# Patient Record
Sex: Male | Born: 1985 | Race: Black or African American | Hispanic: No | Marital: Single | Smoking: Never smoker
Health system: Southern US, Community
[De-identification: ages and names within clinical notes are randomized; demographics above are authoritative.]

## PROBLEM LIST (undated history)

## (undated) DIAGNOSIS — I10 Essential (primary) hypertension: Secondary | ICD-10-CM

---

## 2006-03-13 ENCOUNTER — Emergency Department: Payer: Self-pay | Admitting: Emergency Medicine

## 2007-03-20 ENCOUNTER — Emergency Department: Payer: Self-pay | Admitting: Emergency Medicine

## 2008-05-08 IMAGING — CT CT HEAD WITHOUT CONTRAST
2 series · 16 of 30 positions shown, 20 images · non-contrast
Comparison: none

REASON FOR EXAM: head injury
COMMENTS:

[Series 2: without · axial · non-contrast · 0.39mm/px · z∈[-183,-48]mm · 13 of 33 slices shown, 17 images]
[im 3/33  brain]
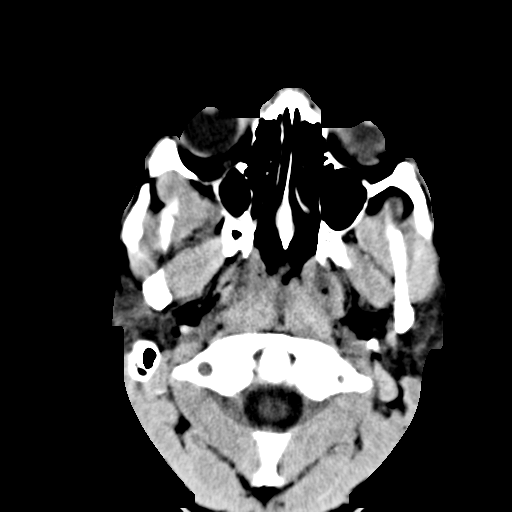
[im 3/33  bone]
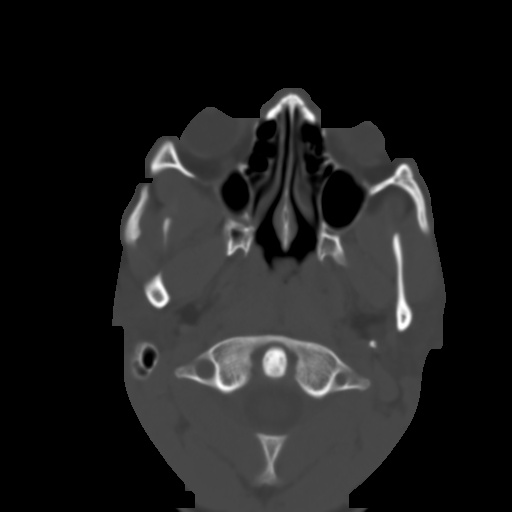
[im 5/33  brain]
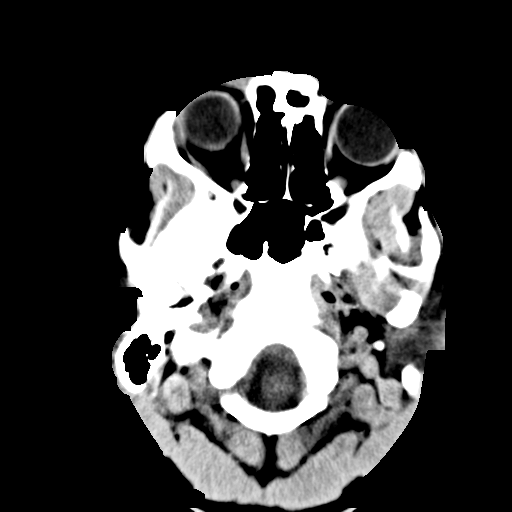
[im 7/33  brain]
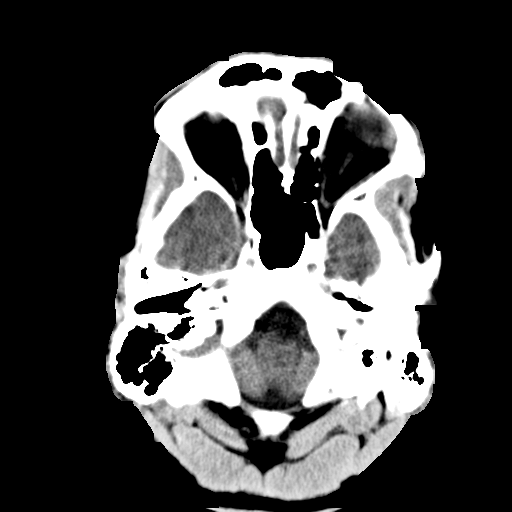
[im 10/33  brain]
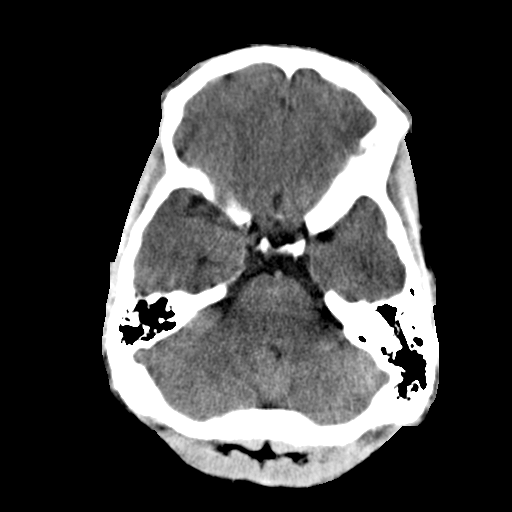
[im 12/33  brain]
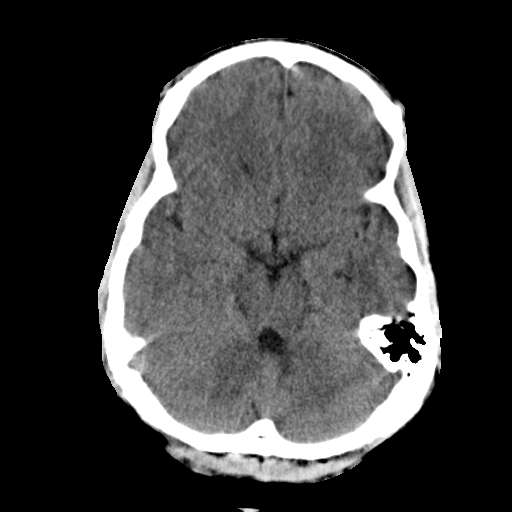
[im 12/33  bone]
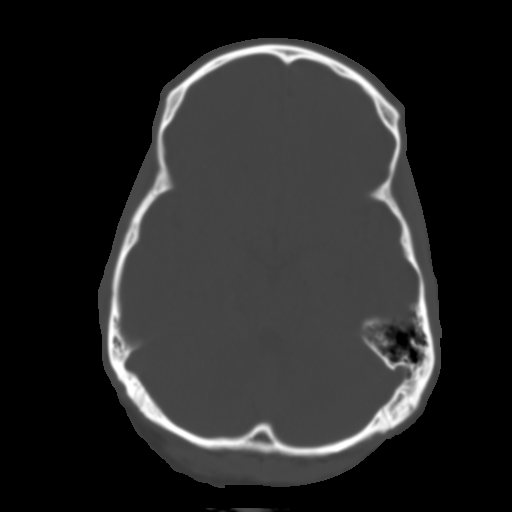
[im 14/33  brain]
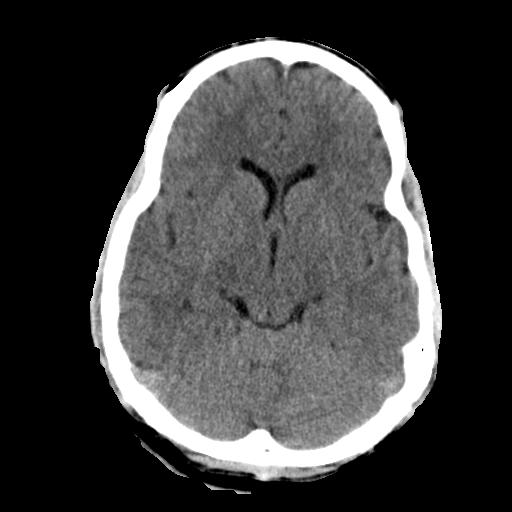
[im 17/33  brain]
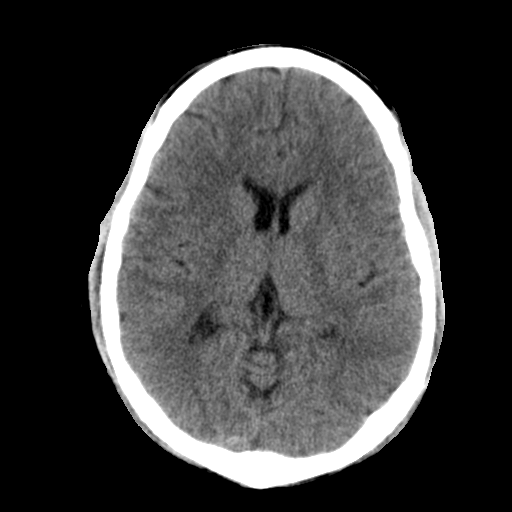
[im 19/33  brain]
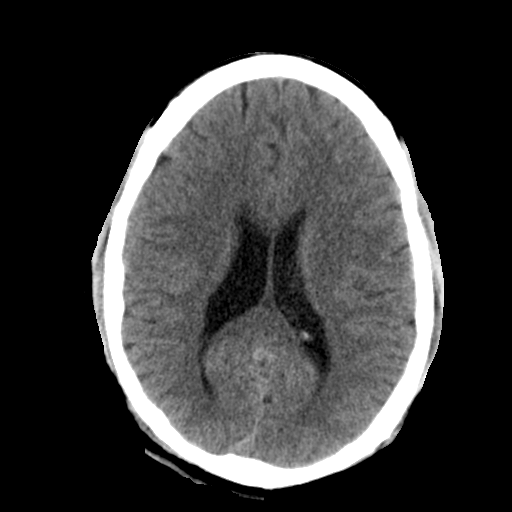
[im 21/33  brain]
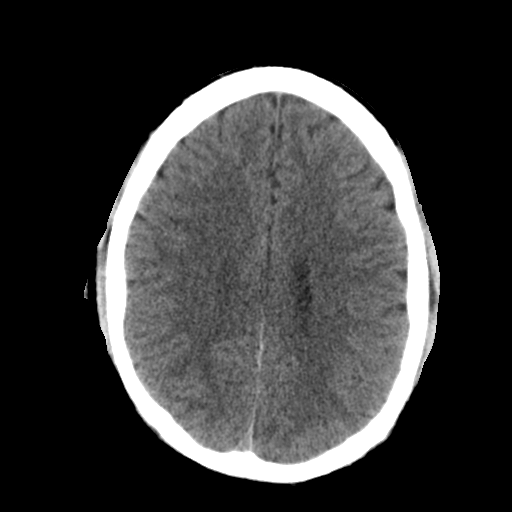
[im 21/33  bone]
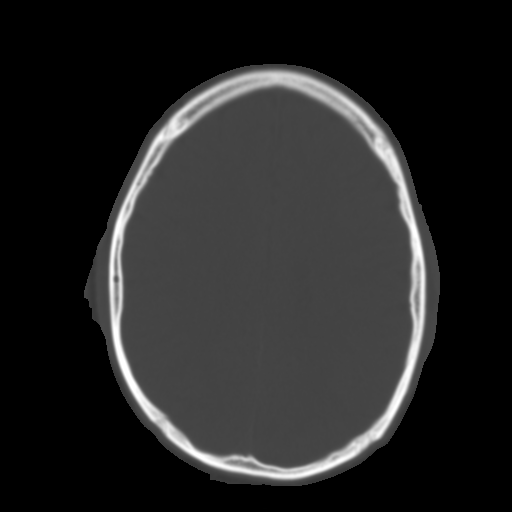
[im 23/33  brain]
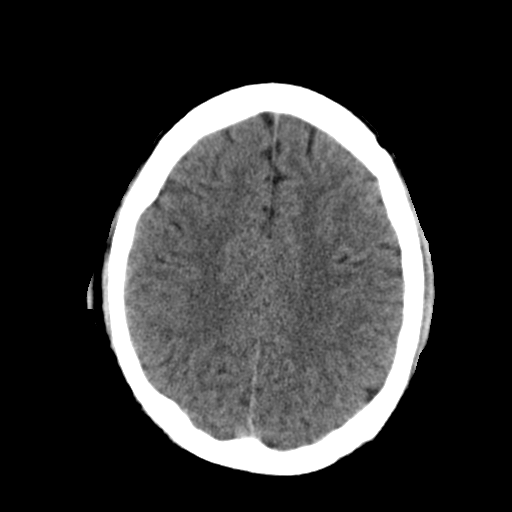
[im 26/33  brain]
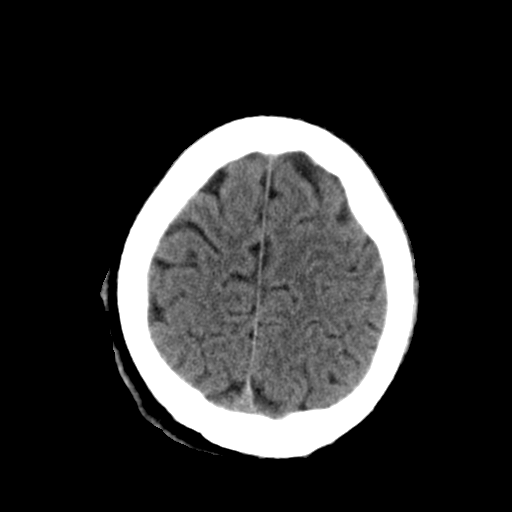
[im 28/33  brain]
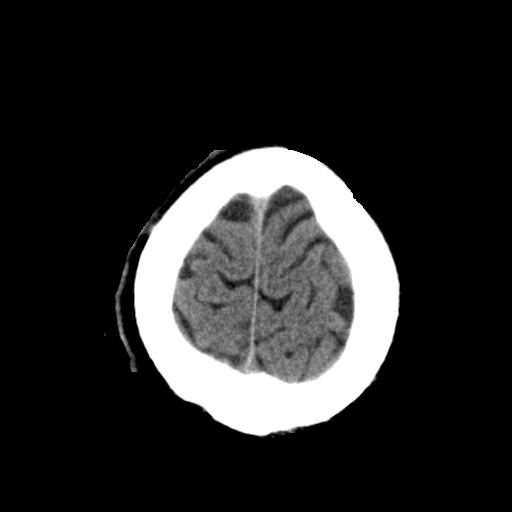
[im 30/33  brain]
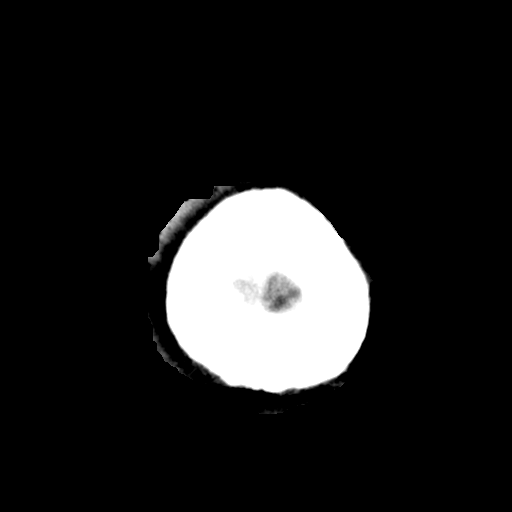
[im 30/33  bone]
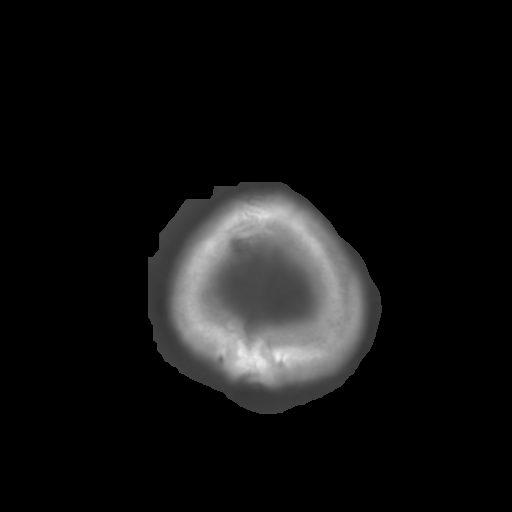

[Series 3: bone · axial · 0.39mm/px · z∈[-183,-138]mm · 3 of 33 slices shown]
[im 3/33  bone]
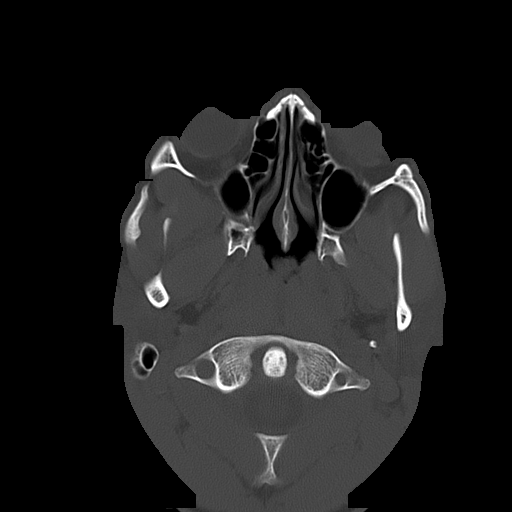
[im 7/33  bone]
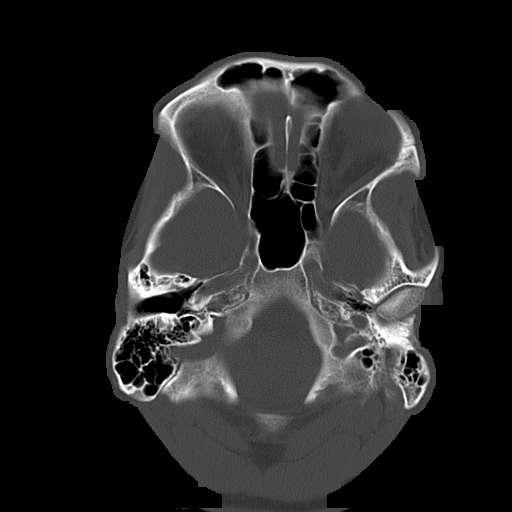
[im 12/33  bone]
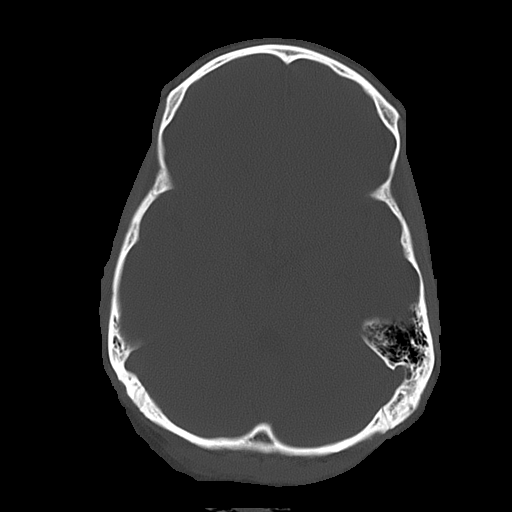

[16 of 30 positions shown; findings below may reference images not displayed]

PROCEDURE:     CT  - CT HEAD WITHOUT CONTRAST  - March 20, 2007 [DATE]

RESULT:     Multiple areas of scalp hematoma are identified within the RIGHT
frontal and frontoparietal region. There does not appear to be evidence of
acute hemorrhage, intracranial acute hemorrhages, free fluid, drainable
loculated fluid collections, midline shift or mass effect.  The visualized
bony skeleton demonstrates no evidence of fracture or dislocation.
IMPRESSION: 1)Scalp hematomas without evidence of acute intracranial or focal
intracranial abnormalities.

Dr. Greg of the Emergency Room was informed of these findings via a
preliminary faxed report on 03-20-07.

## 2009-12-06 ENCOUNTER — Emergency Department: Payer: Self-pay | Admitting: Emergency Medicine

## 2010-04-07 ENCOUNTER — Emergency Department: Payer: Self-pay | Admitting: Emergency Medicine

## 2010-04-08 ENCOUNTER — Emergency Department: Payer: Self-pay | Admitting: Emergency Medicine

## 2014-10-06 ENCOUNTER — Emergency Department: Payer: Self-pay | Admitting: Emergency Medicine

## 2014-10-06 LAB — CBC WITH DIFFERENTIAL/PLATELET
BASOS PCT: 0.2 %
Basophil #: 0 10*3/uL (ref 0.0–0.1)
EOS PCT: 0.2 %
Eosinophil #: 0 10*3/uL (ref 0.0–0.7)
HCT: 46 % (ref 40.0–52.0)
HGB: 15.2 g/dL (ref 13.0–18.0)
Lymphocyte #: 1.2 10*3/uL (ref 1.0–3.6)
Lymphocyte %: 8.9 %
MCH: 31.3 pg (ref 26.0–34.0)
MCHC: 33.2 g/dL (ref 32.0–36.0)
MCV: 94 fL (ref 80–100)
MONOS PCT: 7.6 %
Monocyte #: 1.1 x10 3/mm — ABNORMAL HIGH (ref 0.2–1.0)
Neutrophil #: 11.5 10*3/uL — ABNORMAL HIGH (ref 1.4–6.5)
Neutrophil %: 83.1 %
PLATELETS: 236 10*3/uL (ref 150–440)
RBC: 4.87 10*6/uL (ref 4.40–5.90)
RDW: 13.5 % (ref 11.5–14.5)
WBC: 13.8 10*3/uL — ABNORMAL HIGH (ref 3.8–10.6)

## 2017-07-11 ENCOUNTER — Emergency Department
Admission: EM | Admit: 2017-07-11 | Discharge: 2017-07-11 | Disposition: A | Discharge status: Home / Self Care | Payer: Self-pay | Attending: Emergency Medicine | Admitting: Emergency Medicine

## 2017-07-11 ENCOUNTER — Encounter: Payer: Self-pay | Admitting: Medical Oncology

## 2017-07-11 DIAGNOSIS — M25512 Pain in left shoulder: Secondary | ICD-10-CM | POA: Insufficient documentation

## 2017-07-11 LAB — GLUCOSE, CAPILLARY: Glucose-Capillary: 98 mg/dL (ref 65–99)

## 2017-07-11 MED ORDER — KETOROLAC TROMETHAMINE 30 MG/ML IJ SOLN
30.0000 mg | Freq: Once | INTRAMUSCULAR | Status: AC
  Administered 2017-07-11: 30 mg via INTRAMUSCULAR
  Filled 2017-07-11: qty 1

## 2017-07-11 MED ORDER — NAPROXEN 500 MG PO TABS: 500.0000 mg | ORAL_TABLET | Freq: Two times a day (BID) | ORAL | 0 refills | Status: DC

## 2017-07-11 NOTE — ED Notes (Signed)
Says did a lot of the same thing yesterday at work.  Says today was just making boxes and the left shoulder still hurts with certain movements.  Especially when he tries to lift it.  Also has had some numbnessin fingers for about a month.  Usually thumbs and sometimse 2nd and 3rd fingers on both hands.  Pt in nad.

## 2017-07-11 NOTE — ED Triage Notes (Signed)
Pt reports left shoulder pain that began yesterday while making boxes at work, pain with movement.

## 2017-07-11 NOTE — Discharge Instructions (Addendum)
Follow-up with Dr. Ernest Pine if any continued problems with your shoulder pain. You may use ice or heat to your shoulder as needed for comfort. Begin taking naproxen 500 mg twice a day with food.

## 2017-07-11 NOTE — ED Provider Notes (Signed)
[facility] Emergency Department Provider Note  ____________________________________________   First MD Initiated Contact with Patient 07/11/17 [phone]     (approximate)  I have reviewed the triage vital signs and the nursing notes.   HISTORY  Chief Complaint Shoulder Injury   HPI Jordan Saunders is a 31 y.o. male is here complaining of left shoulder pain. Patient states that he believes that the pain started after moving a lot of boxes at work yesterday. He denies specific injury. He has not taken any over-the-counter medication for his pain. She denies any previous injury to his left shoulder. Pain is increased with range of motion. Currently rates his pain is 7 out of 10.  History reviewed. No pertinent past medical history.  There are no active problems to display for this patient.   No past surgical history on file.  Prior to Admission medications   Medication Sig Start Date End Date Taking? Authorizing Provider  naproxen (NAPROSYN) 500 MG tablet Take 1 tablet (500 mg total) by mouth 2 (two) times daily with a meal. 07/11/17   Tommi Rumps, PA-C    Allergies Patient has no known allergies.  No family history on file.  Social History Social History  Substance Use Topics  . Smoking status: Not on file  . Smokeless tobacco: Not on file  . Alcohol use Not on file    Review of Systems Constitutional: No fever/chills Cardiovascular: Denies chest pain. Respiratory: Denies shortness of breath. Gastrointestinal:   No nausea, no vomiting.  Musculoskeletal: Positive for left shoulder pain. Neurological: Negative for headaches, focal weakness.  Positive chronic sensation alteration distal digits bilateral hands. Patient has family members who are diabetic that is on sure of his status.  ____________________________________________   PHYSICAL EXAM:  VITAL SIGNS: ED Triage Vitals  Enc Vitals Group     BP 07/11/17 0727 128/83     Pulse  Rate 07/11/17 0727 72     Resp 07/11/17 0727 18     Temp 07/11/17 0727 98.5 F (36.9 C)     Temp Source 07/11/17 0727 Oral     SpO2 07/11/17 0727 96 %     Weight 07/11/17 0726 195 lb (88.5 kg)     Height 07/11/17 0726  (1.727 m)     Head Circumference --      Peak Flow --      Pain Score 07/11/17 0725 7     Pain Loc --      Pain Edu? --      Excl. in GC? --    Constitutional: Alert and oriented. Well appearing and in no acute distress. Eyes: Conjunctivae are normal.  Head: Atraumatic. Mouth/Throat: Mucous membranes are moist.  Oropharynx non-erythematous. Neck: No stridor.  Nontender cervical spine to palpation posteriorly. Cardiovascular: Normal rate, regular rhythm. Grossly normal heart sounds.  Good peripheral circulation. Respiratory: Normal respiratory effort.  No retractions. Lungs CTAB. Musculoskeletal: Examination of the left shoulder there is no gross deformity or soft tissue swelling present. No discoloration is present. Range of motion is without crepitus.  Minimal restriction of range of motion secondary to pain. Motor sensory function intact. Neurologic:  Normal speech and language. No gross focal neurologic deficits are appreciated. No gait instability. Skin:  Skin is warm, dry and intact. No ecchymosis, abrasion, erythema noted. Psychiatric: Mood and affect are normal. Speech and behavior are normal.  ____________________________________________   LABS (all labs ordered are listed, but only abnormal results are displayed)  Labs Reviewed  GLUCOSE, CAPILLARY  CBG MONITORING, ED    RADIOLOGY  Deferred ____________________________________________   PROCEDURES  Procedure(s) performed: None  Procedures  Critical Care performed: No  ____________________________________________   INITIAL IMPRESSION / ASSESSMENT AND PLAN / ED COURSE  patient was given Toradol 30 mg IM since he had not taken any over-the-counter medication for his pain. Patient had  improvement of his shoulder pain and we discussed continued use of anti-inflammatories. He is given a prescription for naproxen 500 mg twice a day with food. He is to follow-up with Dr. Ernest Pine if any continued problems with his shoulder. Patient was fasting at the time he was seen and had a fasting fingerstick done. Patient was made aware that his blood sugar was 98 which is completely normal.   ___________________________________________   FINAL CLINICAL IMPRESSION(S) / ED DIAGNOSES  Final diagnoses:  Acute pain of left shoulder      NEW MEDICATIONS STARTED DURING THIS VISIT:  Discharge Medication List as of 07/11/2017  8:52 AM    START taking these medications   Details  naproxen (NAPROSYN) 500 MG tablet Take 1 tablet (500 mg total) by mouth 2 (two) times daily with a meal., Starting Tue 07/11/2017, Print         Note:  This document was prepared using Dragon voice recognition software and may include unintentional dictation errors.    Tommi Rumps, PA-C 07/11/17 1347    Merrily Brittle, MD 07/11/17 2002

## 2020-10-27 DIAGNOSIS — U071 COVID-19: Secondary | ICD-10-CM | POA: Insufficient documentation

## 2020-10-27 DIAGNOSIS — R059 Cough, unspecified: Secondary | ICD-10-CM | POA: Diagnosis present

## 2020-10-27 DIAGNOSIS — R0602 Shortness of breath: Secondary | ICD-10-CM | POA: Insufficient documentation

## 2020-10-27 DIAGNOSIS — J1282 Pneumonia due to coronavirus disease 2019: Secondary | ICD-10-CM | POA: Insufficient documentation

## 2020-10-28 ENCOUNTER — Emergency Department
Admission: EM | Admit: 2020-10-28 | Discharge: 2020-10-28 | Disposition: A | Discharge status: Home / Self Care | Payer: HRSA Program | Attending: Emergency Medicine | Admitting: Emergency Medicine

## 2020-10-28 ENCOUNTER — Other Ambulatory Visit: Payer: Self-pay

## 2020-10-28 ENCOUNTER — Emergency Department: Payer: HRSA Program

## 2020-10-28 ENCOUNTER — Encounter: Payer: Self-pay | Admitting: Emergency Medicine

## 2020-10-28 DIAGNOSIS — U071 COVID-19: Secondary | ICD-10-CM

## 2020-10-28 DIAGNOSIS — J1282 Pneumonia due to coronavirus disease 2019: Secondary | ICD-10-CM

## 2020-10-28 LAB — BASIC METABOLIC PANEL
Anion gap: 13 (ref 5–15)
BUN: 10 mg/dL (ref 6–20)
CO2: 22 mmol/L (ref 22–32)
Calcium: 9.1 mg/dL (ref 8.9–10.3)
Chloride: 99 mmol/L (ref 98–111)
Creatinine, Ser: 1.25 mg/dL — ABNORMAL HIGH (ref 0.61–1.24)
GFR, Estimated: >60 mL/min (ref >60)
Glucose, Bld: 127 mg/dL — ABNORMAL HIGH (ref 70–99)
Potassium: 3.9 mmol/L (ref 3.5–5.1)
Sodium: 134 mmol/L — ABNORMAL LOW (ref 135–145)

## 2020-10-28 LAB — CBC
HCT: 41.7 % (ref 39.0–52.0)
Hemoglobin: 14.4 g/dL (ref 13.0–17.0)
MCH: 30.4 pg (ref 26.0–34.0)
MCHC: 34.5 g/dL (ref 30.0–36.0)
MCV: 88 fL (ref 80.0–100.0)
Platelets: 211 10*3/uL (ref 150–400)
RBC: 4.74 MIL/uL (ref 4.22–5.81)
RDW: 12.8 % (ref 11.5–15.5)
WBC: 5 10*3/uL (ref 4.0–10.5)
nRBC: 0 % (ref 0.0–0.2)

## 2020-10-28 LAB — TROPONIN I (HIGH SENSITIVITY): Troponin I (High Sensitivity): 4 ng/L (ref <18)

## 2020-10-28 LAB — POC SARS CORONAVIRUS 2 AG -  ED: SARS Coronavirus 2 Ag: POSITIVE — AB

## 2020-10-28 MED ORDER — HYDROCOD POLST-CPM POLST ER 10-8 MG/5ML PO SUER: 5.0000 mL | Freq: Two times a day (BID) | ORAL | 0 refills | Status: AC

## 2020-10-28 MED ORDER — AZITHROMYCIN 500 MG PO TABS
500.0000 mg | ORAL_TABLET | Freq: Once | ORAL | Status: AC
  Administered 2020-10-28: 500 mg via ORAL
  Filled 2020-10-28: qty 1

## 2020-10-28 MED ORDER — AZITHROMYCIN 250 MG PO TABS: 250.0000 mg | ORAL_TABLET | Freq: Every day | ORAL | 0 refills | Status: AC

## 2020-10-28 MED ORDER — PREDNISONE 50 MG PO TABS: ORAL_TABLET | ORAL | 0 refills | Status: DC

## 2020-10-28 MED ORDER — ALBUTEROL SULFATE HFA 108 (90 BASE) MCG/ACT IN AERS
2.0000 | INHALATION_SPRAY | RESPIRATORY_TRACT | 0 refills | Status: DC | PRN
Start: 2020-10-28 — End: 2024-07-15

## 2020-10-28 MED ORDER — HYDROCOD POLST-CPM POLST ER 10-8 MG/5ML PO SUER
5.0000 mL | Freq: Once | ORAL | Status: AC
  Administered 2020-10-28: 5 mL via ORAL
  Filled 2020-10-28: qty 5

## 2020-10-28 MED ORDER — PREDNISONE 20 MG PO TABS
50.0000 mg | ORAL_TABLET | Freq: Once | ORAL | Status: AC
  Administered 2020-10-28: 50 mg via ORAL
  Filled 2020-10-28: qty 3

## 2020-10-28 NOTE — ED Provider Notes (Signed)
[facility] Emergency Department Provider Note   ____________________________________________   Event Date/Time   First MD Initiated Contact with Patient 10/28/20 [phone]     (approximate)  I have reviewed the triage vital signs and the nursing notes.   HISTORY  Chief Complaint Chills, Cough, and Shortness of Breath    HPI Jordan Saunders is a 35 y.o. male who presents to the ED from home with a chief complaint of cough, body aches, chills, headache and shortness of breath.  Symptoms x1 week, worse over the past 2 days.  Patient is unvaccinated against COVID-19.  Also reports sharp right rib pain only after hard coughing.  Denies abdominal pain, nausea/vomiting/diarrhea.     Past medical history None  There are no problems to display for this patient.   History reviewed. No pertinent surgical history.  Prior to Admission medications   Medication Sig Start Date End Date Taking? Authorizing Provider  albuterol (VENTOLIN HFA) 108 (90 Base) MCG/ACT inhaler Inhale 2 puffs into the lungs every 4 (four) hours as needed for wheezing or shortness of breath. 10/28/20  Yes Irean Hong, MD  azithromycin (ZITHROMAX) 250 MG tablet Take 1 tablet (250 mg total) by mouth daily. 10/28/20  Yes Irean Hong, MD  chlorpheniramine-HYDROcodone [facility] PENNKINETIC ER) 10-8 MG/5ML SUER Take 5 mLs by mouth 2 (two) times daily. 10/28/20  Yes Irean Hong, MD  predniSONE (DELTASONE) 50 MG tablet 1 tablet daily until finished 10/28/20  Yes Irean Hong, MD  naproxen (NAPROSYN) 500 MG tablet Take 1 tablet (500 mg total) by mouth 2 (two) times daily with a meal. 07/11/17   Tommi Rumps, PA-C    Allergies Patient has no known allergies.  History reviewed. No pertinent family history.  Social History Social History   Tobacco Use  . Smoking status: Never Smoker  . Smokeless tobacco: Never Used  Substance Use Topics  . Alcohol use: Yes    Alcohol/week: 6.0 standard drinks     Types: 6 Cans of beer per week  . Drug use: Yes    Types: Marijuana    Review of Systems  Constitutional: No fever/chills Eyes: No visual changes. ENT: No sore throat. Cardiovascular: Positive for right rib pain on coughing.  Denies chest pain. Respiratory: Positive for productive cough and shortness of breath. Gastrointestinal: No abdominal pain.  No nausea, no vomiting.  No diarrhea.  No constipation. Genitourinary: Negative for dysuria. Musculoskeletal: Negative for back pain. Skin: Negative for rash. Neurological: Negative for headaches, focal weakness or numbness.   ____________________________________________   PHYSICAL EXAM:  VITAL SIGNS: ED Triage Vitals  Enc Vitals Group     BP 10/28/20 0052 (!) 144/83     Pulse Rate 10/28/20 0052 (!) 103     Resp 10/28/20 0052 20     Temp 10/28/20 0052 99.4 F (37.4 C)     Temp Source 10/28/20 0052 Oral     SpO2 10/28/20 0052 96 %     Weight 10/28/20 0053 200 lb (90.7 kg)     Height 10/28/20 0053 5\' 7"  (1.702 m)     Head Circumference --      Peak Flow --      Pain Score 10/28/20 0053 7     Pain Loc --      Pain Edu? --      Excl. in GC? --     Constitutional: Alert and oriented. Well appearing and in no acute distress. Eyes: Conjunctivae are normal.  PERRL. EOMI. Head: Atraumatic. Nose: No congestion/rhinnorhea. Mouth/Throat: Mucous membranes are moist.   Neck: No stridor.   Cardiovascular: Normal rate, regular rhythm. Grossly normal heart sounds.  Good peripheral circulation. Respiratory: Normal respiratory effort.  No retractions. Lungs CTAB. Gastrointestinal: Soft and nontender. No distention. No abdominal bruits. No CVA tenderness. Musculoskeletal: No lower extremity tenderness nor edema.  No joint effusions. Neurologic:  Normal speech and language. No gross focal neurologic deficits are appreciated. No gait instability. Skin:  Skin is warm, dry and intact. No rash noted. Psychiatric: Mood and affect are  normal. Speech and behavior are normal.  ____________________________________________   LABS (all labs ordered are listed, but only abnormal results are displayed)  Labs Reviewed  BASIC METABOLIC PANEL - Abnormal; Notable for the following components:      Result Value   Sodium 134 (*)    Glucose, Bld 127 (*)    Creatinine, Ser 1.25 (*)    All other components within normal limits  POC SARS CORONAVIRUS 2 AG -  ED - Abnormal; Notable for the following components:   SARS Coronavirus 2 Ag Positive (*)    All other components within normal limits  CBC  TROPONIN I (HIGH SENSITIVITY)   ____________________________________________  EKG  None ____________________________________________  RADIOLOGY I, Rossanna Spitzley J, personally viewed and evaluated these images (plain radiographs) as part of my medical decision making, as well as reviewing the written report by the radiologist.  ED MD interpretation: Atypical multifocal pneumonia  Official radiology report(s): DG Chest 2 View  Result Date: 10/28/2020 CLINICAL DATA:  Shortness of breath. EXAM: CHEST - 2 VIEW COMPARISON:  None. FINDINGS: There are hazy peripheral bilateral airspace opacities. No pneumothorax. Large pleural effusion. The heart size is unremarkable. There is no acute osseous abnormality. IMPRESSION: Hazy peripheral bilateral airspace opacities consistent with multifocal pneumonia (viral or bacterial). Electronically Signed   By: Katherine Mantle M.D.   On: 10/28/2020 01:22    ____________________________________________   PROCEDURES  Procedure(s) performed (including Critical Care):  Procedures   ____________________________________________   INITIAL IMPRESSION / ASSESSMENT AND PLAN / ED COURSE  As part of my medical decision making, I reviewed the following data within the electronic MEDICAL RECORD NUMBER Nursing notes reviewed and incorporated, Labs reviewed, Old chart reviewed, Radiograph reviewed and Notes from  prior ED visits     35 year old otherwise healthy male presenting with cough, body aches, chills, headache, rib pain on coughing. Differential includes, but is not limited to, viral syndrome, bronchitis including COPD exacerbation, pneumonia, reactive airway disease including asthma, CHF including exacerbation with or without pulmonary/interstitial edema, pneumothorax, ACS, thoracic trauma, and pulmonary embolism.  Laboratory results remarkable for mild AKI.  Rapid antigen Covid positive.  Multifocal pneumonia on chest x-ray.  Patient is afebrile, not tachypneic nor hypoxic.  Will treat with steroids, antibiotic, cough medicine and albuterol inhaler.  Strict return precautions given.  Patient verbalizes understanding agrees with plan of care.      ____________________________________________   FINAL CLINICAL IMPRESSION(S) / ED DIAGNOSES  Final diagnoses:  Pneumonia due to COVID-19 virus     ED Discharge Orders         Ordered    albuterol (VENTOLIN HFA) 108 (90 Base) MCG/ACT inhaler  Every 4 hours PRN        10/28/20 0305    predniSONE (DELTASONE) 50 MG tablet        10/28/20 0305    chlorpheniramine-HYDROcodone (TUSSIONEX PENNKINETIC ER) 10-8 MG/5ML SUER  2 times daily  10/28/20 0305    azithromycin (ZITHROMAX) 250 MG tablet  Daily        10/28/20 0305          *Please note:  Jordan Saunders was evaluated in Emergency Department on 10/28/2020 for the symptoms described in the history of present illness. He was evaluated in the context of the global COVID-19 pandemic, which necessitated consideration that the patient might be at risk for infection with the SARS-CoV-2 virus that causes COVID-19. Institutional protocols and algorithms that pertain to the evaluation of patients at risk for COVID-19 are in a state of rapid change based on information released by regulatory bodies including the CDC and federal and state organizations. These policies and algorithms were followed  during the patient's care in the ED.  Some ED evaluations and interventions may be delayed as a result of limited staffing during and the pandemic.*   Note:  This document was prepared using Dragon voice recognition software and may include unintentional dictation errors.   Irean Hong, MD 10/28/20 [phone]

## 2020-10-28 NOTE — ED Notes (Signed)
Dr. Dolores Frame notified of positive POC SARS coronavirus test.

## 2020-10-28 NOTE — ED Notes (Signed)
Patient discharged to home per MD order. Patient in stable condition, and deemed medically cleared by ED provider for discharge. Discharge instructions reviewed with patient/family using "Teach Back"; verbalized understanding of medication education and administration, and information about follow-up care. Denies further concerns. ° °

## 2020-10-28 NOTE — ED Triage Notes (Signed)
Pt to ED from home c/o cough, body aches, chills, headache, SOB for a couple days.  Denies known exposure to COVID, denies COVID vaccine, states right rib pain after hard coughs.  Productive yellow cough.

## 2020-10-28 NOTE — ED Notes (Signed)
Pt states coming in with shortness of breath. Pt states he has not been around anyone with covid-19. Pt states rib cage pain.

## 2020-10-28 NOTE — Discharge Instructions (Addendum)
1.  Take steroid and antibiotic daily for the next 4 days: Prednisone 50 mg Azithromycin 250 mg 2.  You may take Tussionex as needed for cough. 3.  You may use albuterol inhaler 2 puffs every 4 hours as needed for difficulty breathing. 4.  Return to the ER for worsening symptoms, persistent vomiting, difficulty breathing or other concerns.

## 2020-10-29 ENCOUNTER — Telehealth: Payer: Self-pay

## 2020-10-29 NOTE — Telephone Encounter (Signed)
Called to discuss with patient about COVID-19 symptoms and the use of one of the available treatments for those with mild to moderate Covid symptoms and at a high risk of hospitalization.  Pt appears to qualify for outpatient treatment due to co-morbid conditions and/or a member of an at-risk group in accordance with the FDA Emergency Use Authorization.    Symptom onset: 10/21/20 Vaccinated: Unknown Booster? Unknown Immunocompromised? No Qualifiers: Obesity Pt. Is out of 7 day window for treatment. Unable to reach pt - Voice mailbox is full, unable to leave a message.  Esther Hardy

## 2021-12-17 IMAGING — CR DG CHEST 2V
1 series · 2 of 2 positions shown · non-contrast
Comparison: None.

CLINICAL DATA: Shortness of breath.

EXAM:
CHEST - 2 VIEW

[Series 1: dg chest 2 view · 0.14mm/px · 2 of 2 slices shown]
[im 1/2]
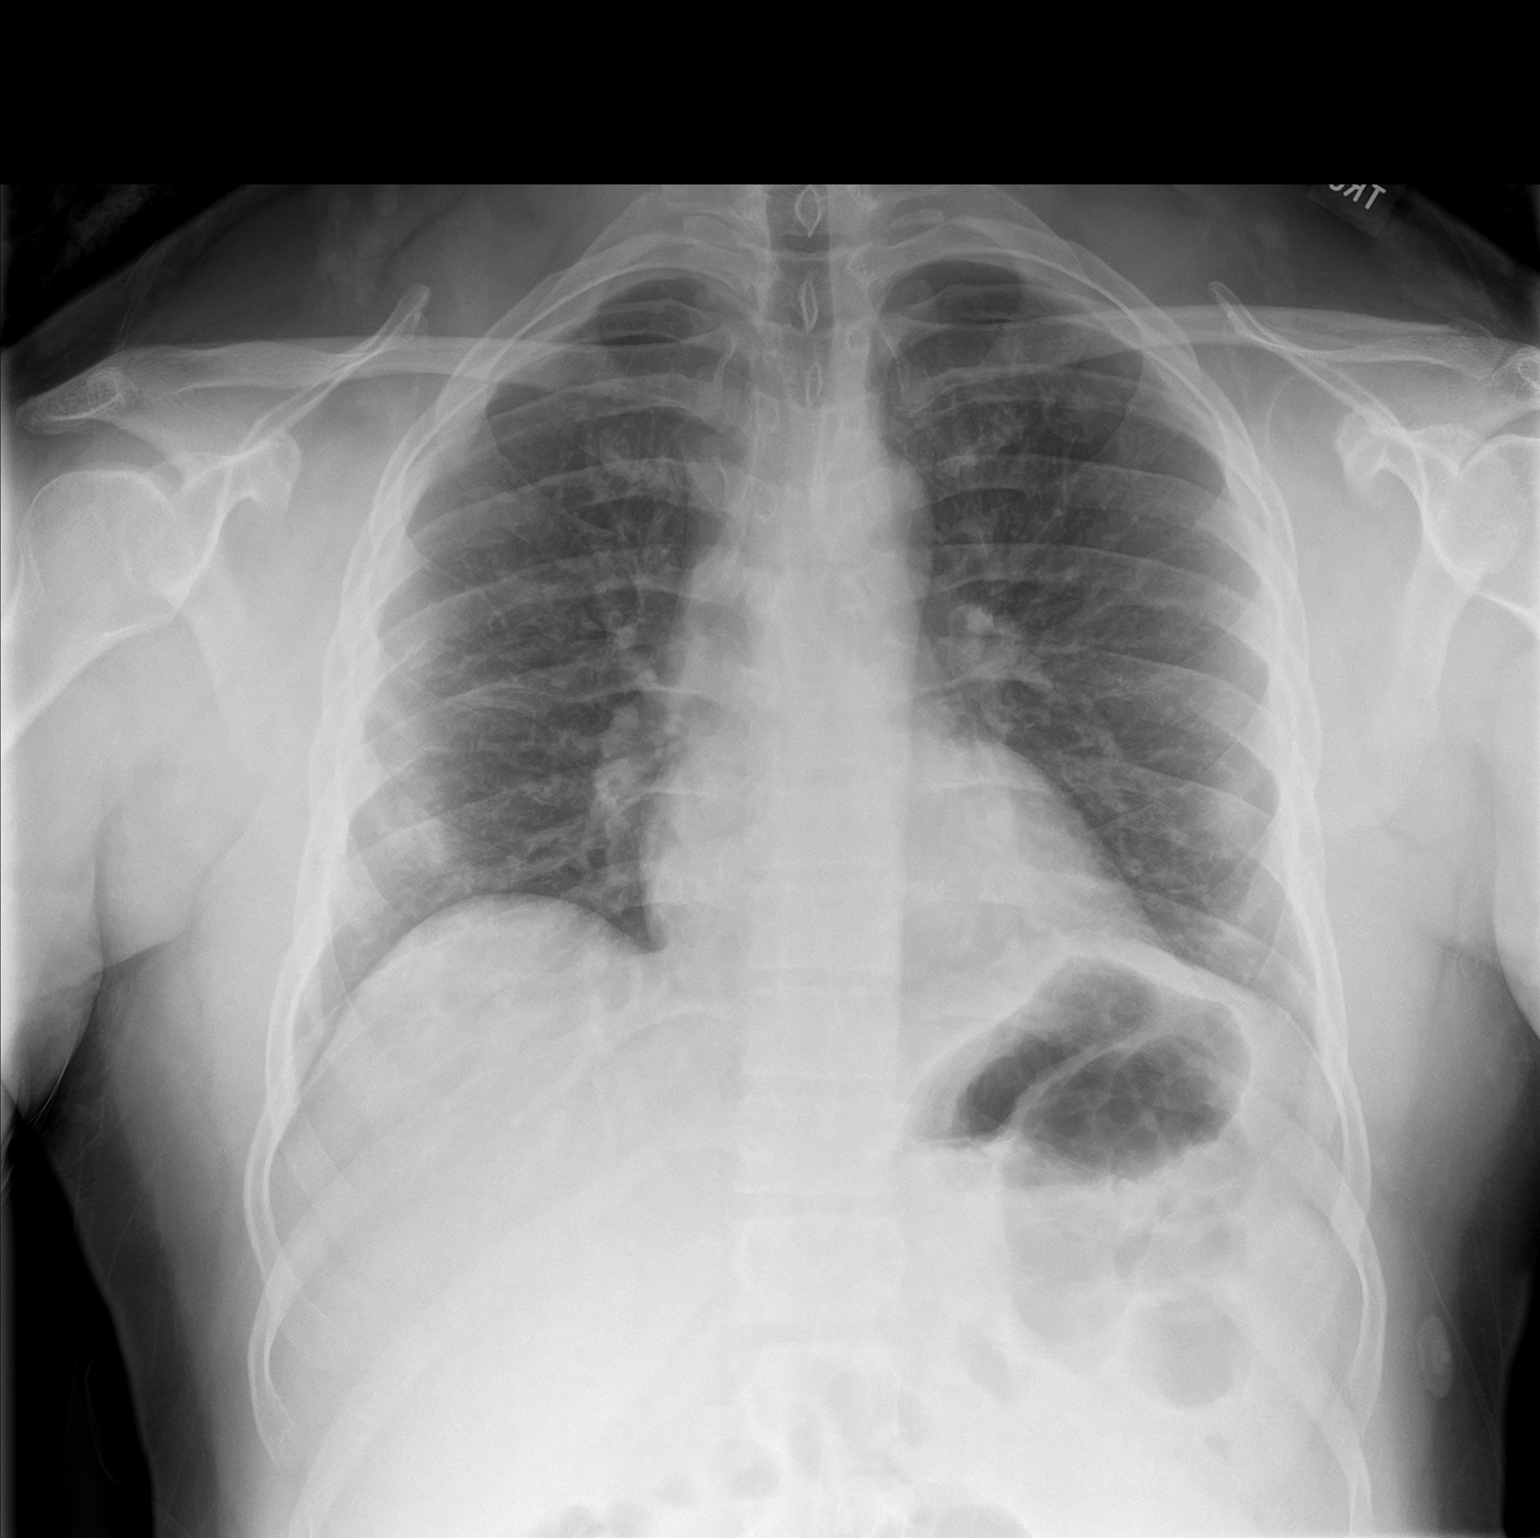
[im 2/2]
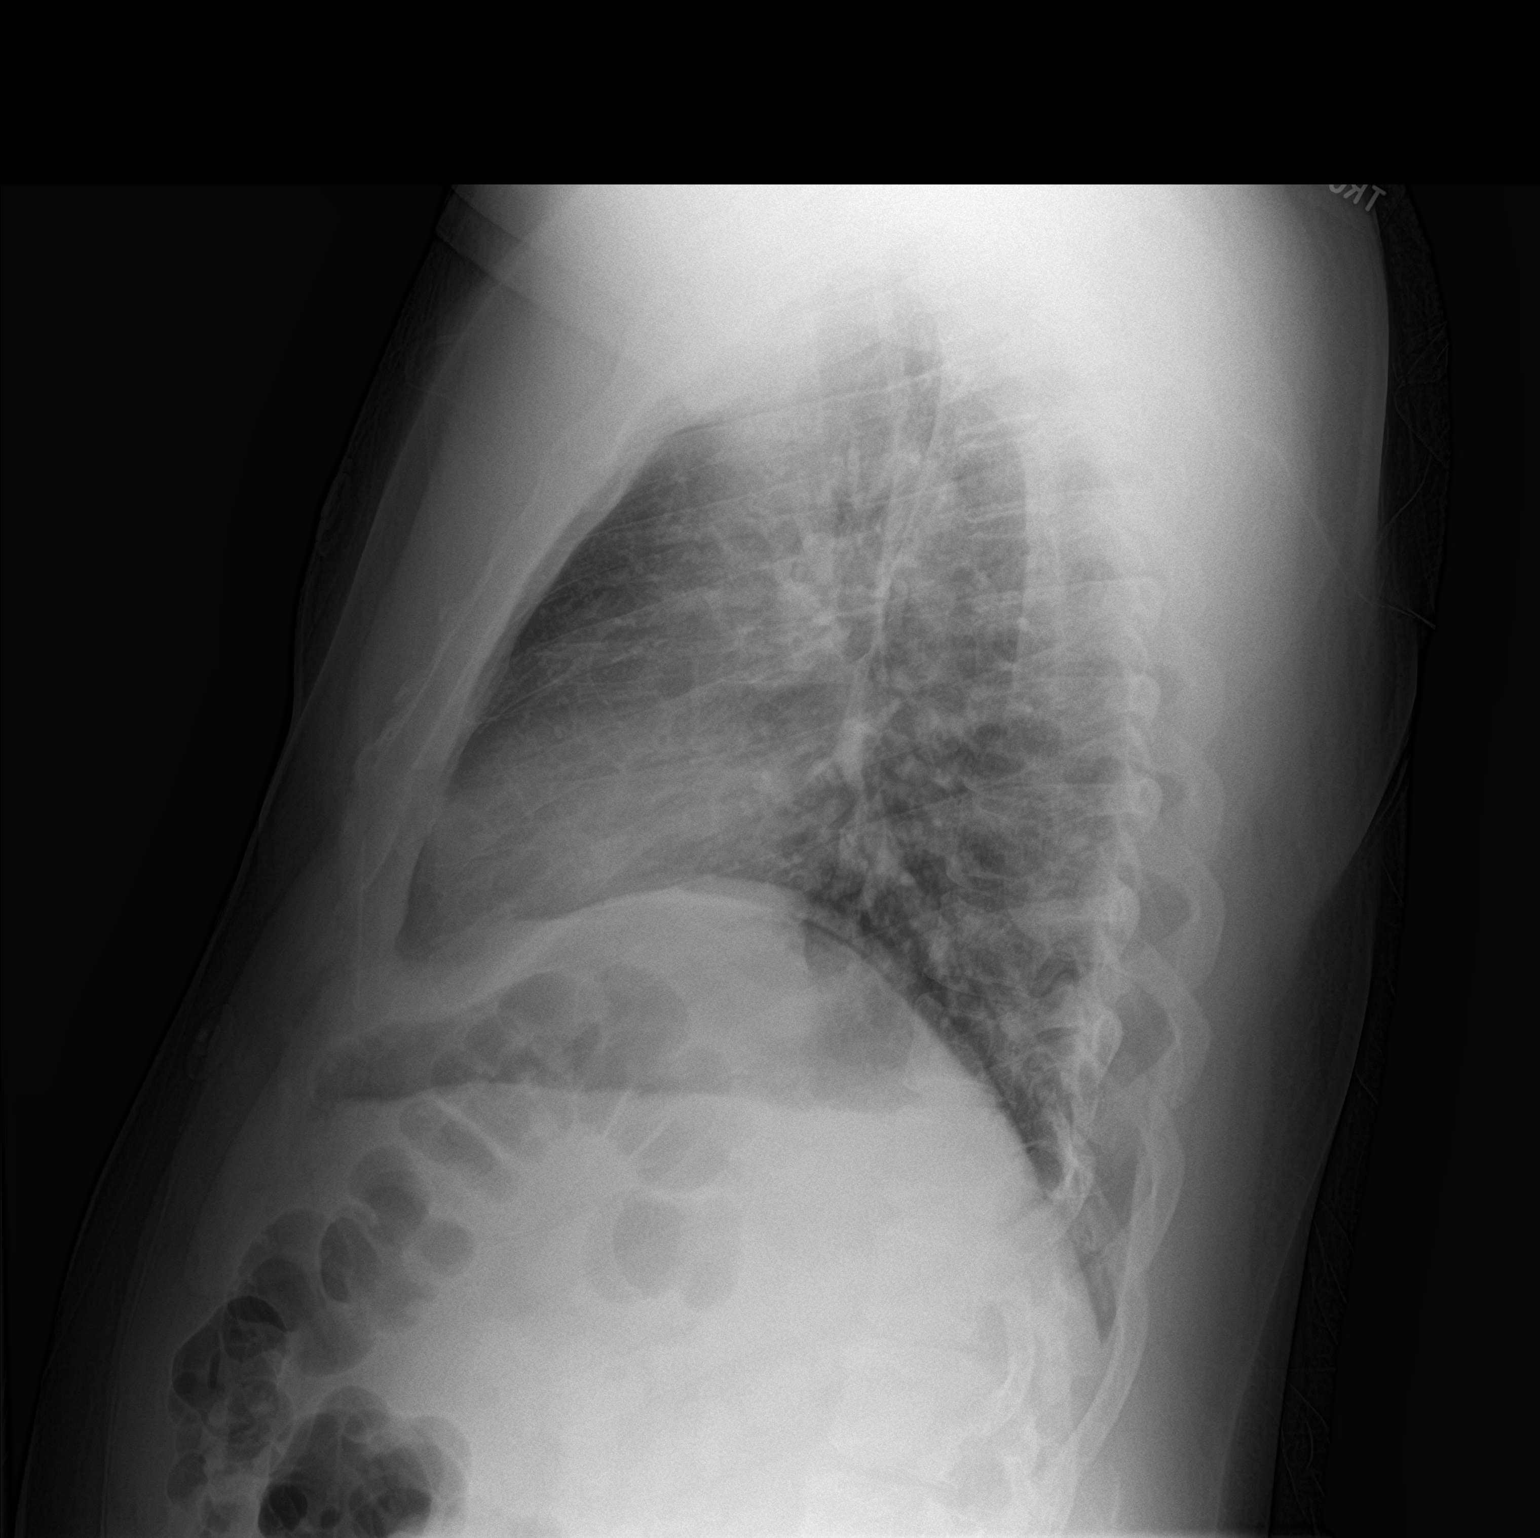

[2 of 2 positions shown; findings below may reference images not displayed]

FINDINGS: There are hazy peripheral bilateral airspace opacities. No
pneumothorax. Large pleural effusion. The heart size is
unremarkable. There is no acute osseous abnormality.
IMPRESSION: Hazy peripheral bilateral airspace opacities consistent with
multifocal pneumonia (viral or bacterial).

## 2022-09-02 DIAGNOSIS — Z419 Encounter for procedure for purposes other than remedying health state, unspecified: Secondary | ICD-10-CM | POA: Diagnosis not present

## 2022-10-03 DIAGNOSIS — Z419 Encounter for procedure for purposes other than remedying health state, unspecified: Secondary | ICD-10-CM | POA: Diagnosis not present

## 2022-11-03 DIAGNOSIS — Z419 Encounter for procedure for purposes other than remedying health state, unspecified: Secondary | ICD-10-CM | POA: Diagnosis not present

## 2022-12-02 DIAGNOSIS — Z419 Encounter for procedure for purposes other than remedying health state, unspecified: Secondary | ICD-10-CM | POA: Diagnosis not present

## 2023-01-02 DIAGNOSIS — Z419 Encounter for procedure for purposes other than remedying health state, unspecified: Secondary | ICD-10-CM | POA: Diagnosis not present

## 2023-01-31 ENCOUNTER — Telehealth: Payer: Self-pay

## 2023-01-31 NOTE — Telephone Encounter (Signed)
Attempted to contact patient to schedule apt. No working #. AS, CMA 

## 2023-02-01 DIAGNOSIS — Z419 Encounter for procedure for purposes other than remedying health state, unspecified: Secondary | ICD-10-CM | POA: Diagnosis not present

## 2023-03-04 DIAGNOSIS — Z419 Encounter for procedure for purposes other than remedying health state, unspecified: Secondary | ICD-10-CM | POA: Diagnosis not present

## 2023-04-03 DIAGNOSIS — Z419 Encounter for procedure for purposes other than remedying health state, unspecified: Secondary | ICD-10-CM | POA: Diagnosis not present

## 2023-05-04 DIAGNOSIS — Z419 Encounter for procedure for purposes other than remedying health state, unspecified: Secondary | ICD-10-CM | POA: Diagnosis not present

## 2023-06-04 DIAGNOSIS — Z419 Encounter for procedure for purposes other than remedying health state, unspecified: Secondary | ICD-10-CM | POA: Diagnosis not present

## 2023-07-04 DIAGNOSIS — Z419 Encounter for procedure for purposes other than remedying health state, unspecified: Secondary | ICD-10-CM | POA: Diagnosis not present

## 2023-08-04 DIAGNOSIS — Z419 Encounter for procedure for purposes other than remedying health state, unspecified: Secondary | ICD-10-CM | POA: Diagnosis not present

## 2023-09-03 DIAGNOSIS — Z419 Encounter for procedure for purposes other than remedying health state, unspecified: Secondary | ICD-10-CM | POA: Diagnosis not present

## 2023-10-04 DIAGNOSIS — Z419 Encounter for procedure for purposes other than remedying health state, unspecified: Secondary | ICD-10-CM | POA: Diagnosis not present

## 2023-11-04 DIAGNOSIS — Z419 Encounter for procedure for purposes other than remedying health state, unspecified: Secondary | ICD-10-CM | POA: Diagnosis not present

## 2023-12-02 DIAGNOSIS — Z419 Encounter for procedure for purposes other than remedying health state, unspecified: Secondary | ICD-10-CM | POA: Diagnosis not present

## 2023-12-06 ENCOUNTER — Emergency Department
Admission: EM | Admit: 2023-12-06 | Discharge: 2023-12-06 | Disposition: A | Discharge status: Home / Self Care | Attending: Emergency Medicine | Admitting: Emergency Medicine

## 2023-12-06 ENCOUNTER — Encounter: Payer: Self-pay | Admitting: Emergency Medicine

## 2023-12-06 ENCOUNTER — Other Ambulatory Visit: Payer: Self-pay

## 2023-12-06 DIAGNOSIS — I10 Essential (primary) hypertension: Secondary | ICD-10-CM | POA: Insufficient documentation

## 2023-12-06 DIAGNOSIS — G5602 Carpal tunnel syndrome, left upper limb: Secondary | ICD-10-CM | POA: Diagnosis not present

## 2023-12-06 DIAGNOSIS — R202 Paresthesia of skin: Secondary | ICD-10-CM | POA: Diagnosis present

## 2023-12-06 LAB — CBG MONITORING, ED: Glucose-Capillary: 118 mg/dL — ABNORMAL HIGH (ref 70–99)

## 2023-12-06 MED ORDER — AMLODIPINE BESYLATE 5 MG PO TABS: 5.0000 mg | ORAL_TABLET | Freq: Every day | ORAL | 1 refills | Status: AC

## 2023-12-06 NOTE — ED Triage Notes (Addendum)
 Patient ambulatory to triage with steady gait, without difficulty or distress noted; pt reports bilat hand numbness and pain x month

## 2023-12-06 NOTE — ED Provider Notes (Signed)
 [facility] Provider Note    Event Date/Time   First MD Initiated Contact with Patient 12/06/23 0145     (approximate)   History   Chief Complaint: Hand Problem   HPI  Jordan Saunders is a 38 y.o. male with a past history of hypertension who comes ED complaining of tingling in bilateral hands, left greater than right for the past month, intermittent.  Also complains of some tingling in the left foot.  No pain or motor weakness.  No headache or neck pain, no falls or trauma.          Physical Exam   Triage Vital Signs: ED Triage Vitals  Encounter Vitals Group     BP 12/06/23 0059 (!) 172/100     Systolic BP Percentile --      Diastolic BP Percentile --      Pulse Rate 12/06/23 0059 87     Resp 12/06/23 0059 18     Temp 12/06/23 0059 97.9 F (36.6 C)     Temp Source 12/06/23 0059 Oral     SpO2 12/06/23 0059 97 %     Weight 12/06/23 0057 220 lb (99.8 kg)     Height 12/06/23 0057 5\' 7"  (1.702 m)     Head Circumference --      Peak Flow --      Pain Score 12/06/23 0057 7     Pain Loc --      Pain Education --      Exclude from Growth Chart --     Most recent vital signs: Vitals:   12/06/23 0135 12/06/23 0252  BP: (!) 150/92 (!) 153/89  Pulse: (!) 111 92  Resp: 18 17  Temp:  98 F (36.7 C)  SpO2: 97% 98%    General: Awake, no distress.  CV:  Good peripheral perfusion.  Resp:  Normal effort.  Abd:  No distention.  Other:  Neuro intact in median, ulnar, radial nerve distribution.  Does have increased pain in the left hand with hyperflexion of the wrist.  No pain with percussion over the median nerve.   ED Results / Procedures / Treatments   Labs (all labs ordered are listed, but only abnormal results are displayed) Labs Reviewed  CBG MONITORING, ED - Abnormal; Notable for the following components:      Result Value   Glucose-Capillary 118 (*)    All other components within normal limits      EKG    RADIOLOGY    PROCEDURES:  Procedures   MEDICATIONS ORDERED IN ED: Medications - No data to display   IMPRESSION / MDM / ASSESSMENT AND PLAN / ED COURSE  I reviewed the triage vital signs and the nursing notes.  DDx: Peripheral neuropathy, uncontrolled diabetes, carpal tunnel syndrome.  No signs of infection or CVA  Patient's presentation is most consistent with acute presentation with potential threat to life or bodily function.  Patient presents with they paresthesia in the fingertips.  CBG unremarkable.  Wrist brace provided for likely carpal tunnel syndrome.       FINAL CLINICAL IMPRESSION(S) / ED DIAGNOSES   Final diagnoses:  Carpal tunnel syndrome of left wrist  Essential hypertension     Rx / DC Orders   ED Discharge Orders          Ordered    Ambulatory Referral to Primary Care (Establish Care)        12/06/23 0230    amLODipine (NORVASC) 5 MG  tablet  Daily        12/06/23 0232             Note:  This document was prepared using Dragon voice recognition software and may include unintentional dictation errors.   Sharman Cheek, MD 12/06/23 [phone]

## 2023-12-06 NOTE — ED Notes (Signed)
 CBG 118

## 2024-01-13 DIAGNOSIS — Z419 Encounter for procedure for purposes other than remedying health state, unspecified: Secondary | ICD-10-CM | POA: Diagnosis not present

## 2024-02-12 DIAGNOSIS — Z419 Encounter for procedure for purposes other than remedying health state, unspecified: Secondary | ICD-10-CM | POA: Diagnosis not present

## 2024-03-14 DIAGNOSIS — Z419 Encounter for procedure for purposes other than remedying health state, unspecified: Secondary | ICD-10-CM | POA: Diagnosis not present

## 2024-04-13 DIAGNOSIS — Z419 Encounter for procedure for purposes other than remedying health state, unspecified: Secondary | ICD-10-CM | POA: Diagnosis not present

## 2024-04-26 ENCOUNTER — Emergency Department

## 2024-04-26 ENCOUNTER — Other Ambulatory Visit: Payer: Self-pay

## 2024-04-26 ENCOUNTER — Emergency Department
Admission: EM | Admit: 2024-04-26 | Discharge: 2024-04-26 | Disposition: A | Discharge status: Home / Self Care | Attending: Emergency Medicine | Admitting: Emergency Medicine

## 2024-04-26 ENCOUNTER — Encounter: Payer: Self-pay | Admitting: Emergency Medicine

## 2024-04-26 DIAGNOSIS — R002 Palpitations: Secondary | ICD-10-CM | POA: Diagnosis not present

## 2024-04-26 DIAGNOSIS — K219 Gastro-esophageal reflux disease without esophagitis: Secondary | ICD-10-CM | POA: Insufficient documentation

## 2024-04-26 DIAGNOSIS — I10 Essential (primary) hypertension: Secondary | ICD-10-CM | POA: Diagnosis not present

## 2024-04-26 DIAGNOSIS — R079 Chest pain, unspecified: Secondary | ICD-10-CM | POA: Diagnosis not present

## 2024-04-26 HISTORY — DX: Essential (primary) hypertension: I10

## 2024-04-26 LAB — BASIC METABOLIC PANEL WITH GFR
Anion gap: 11 (ref 5–15)
BUN: 12 mg/dL (ref 6–20)
CO2: 24 mmol/L (ref 22–32)
Calcium: 8.8 mg/dL — ABNORMAL LOW (ref 8.9–10.3)
Chloride: 101 mmol/L (ref 98–111)
Creatinine, Ser: 1.22 mg/dL (ref 0.61–1.24)
GFR, Estimated: >60 mL/min (ref >60)
Glucose, Bld: 249 mg/dL — ABNORMAL HIGH (ref 70–99)
Potassium: 3.5 mmol/L (ref 3.5–5.1)
Sodium: 136 mmol/L (ref 135–145)

## 2024-04-26 LAB — CBC
HCT: 39.9 % (ref 39.0–52.0)
Hemoglobin: 13.8 g/dL (ref 13.0–17.0)
MCH: 31.2 pg (ref 26.0–34.0)
MCHC: 34.6 g/dL (ref 30.0–36.0)
MCV: 90.3 fL (ref 80.0–100.0)
Platelets: 261 K/uL (ref 150–400)
RBC: 4.42 MIL/uL (ref 4.22–5.81)
RDW: 12.8 % (ref 11.5–15.5)
WBC: 6.5 K/uL (ref 4.0–10.5)
nRBC: 0 % (ref 0.0–0.2)

## 2024-04-26 LAB — TROPONIN I (HIGH SENSITIVITY)
Troponin I (High Sensitivity): 4 ng/L (ref <18)
Troponin I (High Sensitivity): 3 ng/L (ref <18)

## 2024-04-26 MED ORDER — ALUM & MAG HYDROXIDE-SIMETH 200-200-20 MG/5ML PO SUSP
30.0000 mL | Freq: Once | ORAL | Status: AC
  Administered 2024-04-26: 30 mL via ORAL
  Filled 2024-04-26: qty 30

## 2024-04-26 MED ORDER — FAMOTIDINE 20 MG PO TABS
40.0000 mg | ORAL_TABLET | Freq: Once | ORAL | Status: AC
  Administered 2024-04-26: 40 mg via ORAL
  Filled 2024-04-26: qty 2

## 2024-04-26 MED ORDER — LOSARTAN POTASSIUM 25 MG PO TABS: 25.0000 mg | ORAL_TABLET | Freq: Every day | ORAL | 2 refills | Status: AC

## 2024-04-26 MED ORDER — FAMOTIDINE 20 MG PO TABS: 20.0000 mg | ORAL_TABLET | Freq: Two times a day (BID) | ORAL | 0 refills | Status: AC

## 2024-04-26 NOTE — ED Provider Notes (Addendum)
 [facility] Provider Note    Event Date/Time   First MD Initiated Contact with Patient 04/26/24 1039     (approximate)   History   Chief Complaint: Palpitations   HPI  Jordan Saunders is a 38 y.o. male with a history of hypertension who comes ED complaining of chest pain for the last 3 days, it intermittent, worse with eating.  Not exertional, no shortness of breath diaphoresis vomiting or radiation.  Does have a history of hypertension, stopped taking medication about a month ago and has noticed his blood pressure remained persistently elevated.  Sounds like he was developing peripheral edema in the setting of amlodipine  use.        Past Medical History:  Diagnosis Date   Hypertension     Current Outpatient Rx   Order #: [id number] Class: Normal   Order #: [id number] Class: Normal   Order #: [id number] Class: Print   Order #: [id number] Class: Print   Order #: [id number] Class: Print   Order #: [id number] Class: Print   Order #: [id number] Class: Print   Order #: [id number] Class: Print    No past surgical history on file.  Physical Exam   Triage Vital Signs: ED Triage Vitals  Encounter Vitals Group     BP 04/26/24 1030 (!) 182/105     Girls Systolic BP Percentile --      Girls Diastolic BP Percentile --      Boys Systolic BP Percentile --      Boys Diastolic BP Percentile --      Pulse Rate 04/26/24 1030 92     Resp 04/26/24 1030 16     Temp 04/26/24 1030 98.2 F (36.8 C)     Temp Source 04/26/24 1030 Oral     SpO2 04/26/24 1030 94 %     Weight 04/26/24 1026 220 lb (99.8 kg)     Height --      Head Circumference --      Peak Flow --      Pain Score 04/26/24 1026 7     Pain Loc --      Pain Education --      Exclude from Growth Chart --     Most recent vital signs: Vitals:   04/26/24 1030 04/26/24 1130  BP: (!) 182/105 125/76  Pulse: 92 96  Resp: 16 15  Temp: 98.2 F (36.8 C)   SpO2: 94% 96%    General: Awake, no distress.   CV:  Good peripheral perfusion.  Regular rate rhythm.  Normal distal pulses Resp:  Normal effort.  Clear to auscultation bilaterally Abd:  No distention.  Soft nontender Other:  Chest wall nontender   ED Results / Procedures / Treatments   Labs (all labs ordered are listed, but only abnormal results are displayed) Labs Reviewed  BASIC METABOLIC PANEL WITH GFR - Abnormal; Notable for the following components:      Result Value   Glucose, Bld 249 (*)    Calcium 8.8 (*)    All other components within normal limits  CBC  TROPONIN I (HIGH SENSITIVITY)  TROPONIN I (HIGH SENSITIVITY)     EKG Interpreted by me Sinus rhythm rate of 90.  Normal axis intervals QRS ST segments T waves   RADIOLOGY Chest x-ray interpreted by me, appears normal.  Radiology report reviewed   PROCEDURES:  Procedures   MEDICATIONS ORDERED IN ED: Medications  alum & mag hydroxide-simeth (MAALOX/MYLANTA) 200-200-20 MG/5ML suspension 30 mL (30 mLs Oral Given 04/26/24 1153)  famotidine (PEPCID) tablet 40 mg (40 mg Oral Given 04/26/24 1153)     IMPRESSION / MDM / ASSESSMENT AND PLAN / ED COURSE  I reviewed the triage vital signs and the nursing notes.  DDx: GERD, pneumonia, NSTEMI, uncontrolled hypertension  Patient's presentation is most consistent with acute presentation with potential threat to life or bodily function.  Presents with atypical chest pain.  Doubt ACS PE dissection.  With his uncontrolled hypertension, will check labs rule out NSTEMI.   ----------------------------------------- 1:30 PM on 04/26/2024 ----------------------------------------- Workup normal.  Feeling better after antacids.  Will refer to primary care, restart antihypertensive, continue Pepcid      FINAL CLINICAL IMPRESSION(S) / ED DIAGNOSES   Final diagnoses:  Gastroesophageal reflux disease without esophagitis  Uncontrolled hypertension     Rx / DC Orders   ED Discharge Orders          Ordered     Ambulatory Referral to Primary Care (Establish Care)        04/26/24 1328    losartan (COZAAR) 25 MG tablet  Daily        04/26/24 1329    famotidine (PEPCID) 20 MG tablet  2 times daily        04/26/24 1329             Note:  This document was prepared using Dragon voice recognition software and may include unintentional dictation errors.   Viviann Pastor, MD 04/26/24 1330    Viviann Pastor, MD 04/26/24 1330

## 2024-04-26 NOTE — ED Triage Notes (Signed)
 C/O chest pain, palpitations since Tuesday.  Has been on BP meds x 2 months, stopped taking meds due to leg and feet swelling. Stopped taking over one month ago.

## 2024-05-14 DIAGNOSIS — Z419 Encounter for procedure for purposes other than remedying health state, unspecified: Secondary | ICD-10-CM | POA: Diagnosis not present

## 2024-06-14 DIAGNOSIS — Z419 Encounter for procedure for purposes other than remedying health state, unspecified: Secondary | ICD-10-CM | POA: Diagnosis not present

## 2024-07-15 ENCOUNTER — Encounter: Payer: Self-pay | Admitting: *Deleted

## 2024-07-15 ENCOUNTER — Other Ambulatory Visit: Payer: Self-pay

## 2024-07-15 ENCOUNTER — Emergency Department
Admission: EM | Admit: 2024-07-15 | Discharge: 2024-07-15 | Disposition: A | Discharge status: Home / Self Care | Attending: Emergency Medicine | Admitting: Emergency Medicine

## 2024-07-15 DIAGNOSIS — J45909 Unspecified asthma, uncomplicated: Secondary | ICD-10-CM | POA: Diagnosis not present

## 2024-07-15 DIAGNOSIS — X58XXXA Exposure to other specified factors, initial encounter: Secondary | ICD-10-CM | POA: Diagnosis not present

## 2024-07-15 DIAGNOSIS — B9789 Other viral agents as the cause of diseases classified elsewhere: Secondary | ICD-10-CM | POA: Diagnosis not present

## 2024-07-15 DIAGNOSIS — J069 Acute upper respiratory infection, unspecified: Secondary | ICD-10-CM | POA: Diagnosis not present

## 2024-07-15 DIAGNOSIS — S39012A Strain of muscle, fascia and tendon of lower back, initial encounter: Secondary | ICD-10-CM | POA: Diagnosis not present

## 2024-07-15 DIAGNOSIS — S3992XA Unspecified injury of lower back, initial encounter: Secondary | ICD-10-CM | POA: Diagnosis present

## 2024-07-15 LAB — RESP PANEL BY RT-PCR (RSV, FLU A&B, COVID)  RVPGX2
Influenza A by PCR: NEGATIVE
Influenza B by PCR: NEGATIVE
Resp Syncytial Virus by PCR: NEGATIVE
SARS Coronavirus 2 by RT PCR: NEGATIVE

## 2024-07-15 LAB — GROUP A STREP BY PCR: Group A Strep by PCR: NOT DETECTED

## 2024-07-15 MED ORDER — IBUPROFEN 600 MG PO TABS
600.0000 mg | ORAL_TABLET | Freq: Once | ORAL | Status: AC
  Administered 2024-07-15: 600 mg via ORAL
  Filled 2024-07-15: qty 1

## 2024-07-15 MED ORDER — CYCLOBENZAPRINE HCL 10 MG PO TABS
10.0000 mg | ORAL_TABLET | Freq: Once | ORAL | Status: AC
  Administered 2024-07-15: 10 mg via ORAL
  Filled 2024-07-15: qty 1

## 2024-07-15 MED ORDER — LIDOCAINE 5 % EX PTCH: 1.0000 | MEDICATED_PATCH | CUTANEOUS | 0 refills | Status: AC

## 2024-07-15 MED ORDER — ALBUTEROL SULFATE HFA 108 (90 BASE) MCG/ACT IN AERS: 2.0000 | INHALATION_SPRAY | Freq: Four times a day (QID) | RESPIRATORY_TRACT | 2 refills | Status: AC | PRN

## 2024-07-15 MED ORDER — CYCLOBENZAPRINE HCL 10 MG PO TABS: 10.0000 mg | ORAL_TABLET | Freq: Three times a day (TID) | ORAL | 0 refills | Status: AC | PRN

## 2024-07-15 MED ORDER — MELOXICAM 15 MG PO TABS: 15.0000 mg | ORAL_TABLET | Freq: Every day | ORAL | 0 refills | Status: AC

## 2024-07-15 MED ORDER — PREDNISONE 50 MG PO TABS: ORAL_TABLET | ORAL | 0 refills | Status: AC

## 2024-07-15 NOTE — ED Triage Notes (Signed)
 Pt ambulatory to triage.   Pt has sinus congestion, cough and sore throat. Sx for 3-4 days.  Pt alert  speech clear.

## 2024-07-15 NOTE — ED Provider Notes (Signed)
 [facility] Provider Note    Event Date/Time   First MD Initiated Contact with Patient 07/15/24 2044     (approximate)   History   Cough    HPI  Jordan Saunders is a 38 y.o. male    with a past medical history of GERD, hypersomnia syndrome, pneumonia, left shoulder pain, asthma, who presents to the ED complaining of sinus congestion, cough and sore at. According to the patient, intensity started 3 days ago with fever, cough, nasal congestion.  On Saturday patient started feeling left lower back pain when coughing or movement, wheezing..  Patient is here by himself but he is not driving tonight.     There are no active problems to display for this patient.    ROS: Patient currently denies any vision changes, tinnitus, difficulty speaking, facial droop, sore throat, chest pain, shortness of breath, abdominal pain, nausea/vomiting/diarrhea, dysuria, or weakness/numbness/paresthesias in any extremity   Physical Exam   Triage Vital Signs: ED Triage Vitals  Encounter Vitals Group     BP 07/15/24 2018 (!) 141/107     Girls Systolic BP Percentile --      Girls Diastolic BP Percentile --      Boys Systolic BP Percentile --      Boys Diastolic BP Percentile --      Pulse Rate 07/15/24 2018 77     Resp 07/15/24 2018 18     Temp 07/15/24 2018 98.1 F (36.7 C)     Temp Source 07/15/24 2018 Oral     SpO2 07/15/24 2018 97 %     Weight 07/15/24 2015 220 lb (99.8 kg)     Height 07/15/24 2015 5' 6 (1.676 m)     Head Circumference --      Peak Flow --      Pain Score 07/15/24 2015 7     Pain Loc --      Pain Education --      Exclude from Growth Chart --     Most recent vital signs: Vitals:   07/15/24 2018  BP: (!) 141/107  Pulse: 77  Resp: 18  Temp: 98.1 F (36.7 C)  SpO2: 97%     Physical Exam Vitals and nursing note reviewed.  During triage patient was Hypertensive  General:          Awake, no distress.  Nasal congestion Head: No tenderness  to palpation in frontal or maxillary sinuses. Mouth: Peritonsillar enlargement, tonsillar erythema, petechiae. Ears: Bilateral otoscopy within normal limits CV:                  Good peripheral perfusion.  Resp:               Normal effort. no tachypnea, no wheezing Abd:                 No distention.  Soft nontender Other:               ED Results / Procedures / Treatments   Labs (all labs ordered are listed, but only abnormal results are displayed) Labs Reviewed  GROUP A STREP BY PCR  RESP PANEL BY RT-PCR (RSV, FLU A&B, COVID)  [id number]        PROCEDURES:  Critical Care performed:   Procedures   MEDICATIONS ORDERED IN ED: Medications  ibuprofen (ADVIL) tablet 600 mg (600 mg Oral Given 07/15/24 2125)  cyclobenzaprine (FLEXERIL) tablet 10 mg (10 mg Oral Given 07/15/24 2124)  Clinical Course as of 07/15/24 2215  Mon Jul 15, 2024  2149 Resp panel by RT-PCR (RSV, Flu A&B, Covid) Throat Negative [AE]  2149 Group A Strep by PCR ([facility] Only) Negative [AE]  2205 Reassessed the patient.  Patient continues with the pain in the left lower lumbar area.  Updated patient with results of respiratory panel and group A strep.  Patient is ready for discharge.  Patient will be discharged with albuterol  inhaler due to his history of asthma, prednisone , Flexeril, meloxicam.  Patient is agreeable with the plan [AE]    Clinical Course User Index [AE] Janit Kast, PA-C    IMPRESSION / MDM / ASSESSMENT AND PLAN / ED COURSE  I reviewed the triage vital signs and the nursing notes.  Differential diagnosis includes, but is not limited to, lumbar muscle strain, viral illness, group A strep, sinus infection, unlikely pneumonia  Patient's presentation is most consistent with acute complicated illness / injury requiring diagnostic workup.   Jordan Saunders is a 38 y.o., male presents today with history of 3 days of fever, adynamia, asthenia.  On physical exam presence of peritonsillar  erythema, tonsillar enlargement, petechias.  Cardiopulmonary no evidence of wheezing, crackles, rales.  Tenderness to palpation in the left lumbar paraspinal muscles.  Rest of physical exam is normal. Plan Ibuprofen, Flexeril Waiting for results of respiratory panel and strep A ordered during triage Respiratory panel and strep a came back negative . Patient's diagnosis is consistent with viral illness. I did not order any imaging, physical exam was reassuring cardiopulmonary patient did not have crackles, Rales, or wheezing . Labs are  reassuring ruling out COVID, influenza, RSV, group A strep. I did review the patient's allergies and medications.The patient is in stable and satisfactory condition for discharge home  Patient will be discharged home with prescriptions for albuterol  inhaler, prednisone , Flexeril, meloxicam, lidocaine patch. Patient is to follow up with PCP as needed or otherwise directed. Patient is given ED precautions to return to the ED for any worsening or new symptoms.  Work note will be provided Discussed plan of care with patient, answered all of patient's questions, Patient agreeable to plan of care. Advised patient to take medications according to the instructions on the label. Discussed possible side effects of new medications. Patient verbalized understanding.  FINAL CLINICAL IMPRESSION(S) / ED DIAGNOSES   Final diagnoses:  Viral URI with cough  Strain of lumbar region, initial encounter     Rx / DC Orders   ED Discharge Orders          Ordered    cyclobenzaprine (FLEXERIL) 10 MG tablet  3 times daily PRN        07/15/24 2215    meloxicam (MOBIC) 15 MG tablet  Daily        07/15/24 2215    predniSONE  (DELTASONE ) 50 MG tablet        07/15/24 2215    lidocaine (LIDODERM) 5 %  Every 24 hours        07/15/24 2215    albuterol  (VENTOLIN  HFA) 108 (90 Base) MCG/ACT inhaler  Every 6 hours PRN        07/15/24 2215             Note:  This document was prepared  using Dragon voice recognition software and may include unintentional dictation errors.   Janit Kast, PA-C 07/15/24 2215    Levander Slate, MD 07/15/24 [phone]

## 2024-07-15 NOTE — Discharge Instructions (Addendum)
 You have been diagnosed with viral upper respiratory infection with cough, lumbar strain.  Please use your inhaler 2 puffs every 6 hours as needed for wheezing.  Please take prednisone  1 tablet with breakfast.  You can take Flexeril 1 tablet every 8 hours as needed for pain on your lumbar area.  Please avoid driving while taking Flexeril.  If you are planning to drive please take Flexeril at bedtime.  Please take meloxicam 1 tablet with breakfast.  Please come back to ED or go to your PCP if you have new symptoms symptoms worsen.
